# Patient Record
Sex: Male | Born: 1985 | Hispanic: Yes | Marital: Single
Health system: Southern US, Community
[De-identification: ages and names within clinical notes are randomized; demographics above are authoritative.]

---

## 2020-08-09 ENCOUNTER — Other Ambulatory Visit: Payer: Self-pay

## 2020-08-09 ENCOUNTER — Emergency Department (HOSPITAL_COMMUNITY): Payer: No Typology Code available for payment source

## 2020-08-09 ENCOUNTER — Emergency Department (HOSPITAL_COMMUNITY)
Admission: EM | Admit: 2020-08-09 | Discharge: 2020-08-09 | Disposition: A | Discharge status: Home / Self Care | Payer: No Typology Code available for payment source | Attending: Emergency Medicine | Admitting: Emergency Medicine

## 2020-08-09 ENCOUNTER — Encounter (HOSPITAL_COMMUNITY): Payer: Self-pay

## 2020-08-09 DIAGNOSIS — I10 Essential (primary) hypertension: Secondary | ICD-10-CM | POA: Insufficient documentation

## 2020-08-09 DIAGNOSIS — Y92410 Unspecified street and highway as the place of occurrence of the external cause: Secondary | ICD-10-CM | POA: Diagnosis not present

## 2020-08-09 DIAGNOSIS — M542 Cervicalgia: Secondary | ICD-10-CM | POA: Diagnosis present

## 2020-08-09 MED ORDER — ACETAMINOPHEN 325 MG PO TABS
650.0000 mg | ORAL_TABLET | Freq: Once | ORAL | Status: AC
  Administered 2020-08-09: 650 mg via ORAL
  Filled 2020-08-09: qty 2
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
